# Patient Record
Sex: Male | Born: 1980 | Race: White | Hispanic: No | Marital: Single | Smoking: Never smoker
Health system: Southern US, Community
[De-identification: ages and names within clinical notes are randomized; demographics above are authoritative.]

## PROBLEM LIST (undated history)

## (undated) DIAGNOSIS — F419 Anxiety disorder, unspecified: Secondary | ICD-10-CM

## (undated) DIAGNOSIS — A419 Sepsis, unspecified organism: Secondary | ICD-10-CM

---

## 2014-03-30 DIAGNOSIS — A419 Sepsis, unspecified organism: Secondary | ICD-10-CM

## 2014-03-30 HISTORY — DX: Sepsis, unspecified organism: A41.9

## 2014-04-09 ENCOUNTER — Inpatient Hospital Stay: Payer: Self-pay | Admitting: Internal Medicine

## 2014-04-20 ENCOUNTER — Ambulatory Visit: Payer: Self-pay | Admitting: Registered Nurse

## 2014-04-21 ENCOUNTER — Ambulatory Visit: Payer: Self-pay | Admitting: Registered Nurse

## 2014-06-28 NOTE — H&P (Signed)
PATIENT NAME:  Philip Bright, Philip Bright MR#:  [id number] DATE OF BIRTH:  06-Apr-1980  DATE OF ADMISSION:  04/09/2014  REFERRING PHYSICIAN: Bobetta Lime A. Inocencio Homes, MD   PRIMARY CARE PHYSICIAN: None.   CHIEF COMPLAINT: Shortness of breath, palpitations.  HISTORY OF PRESENT ILLNESS: A 34 year old Caucasian gentleman without significant past medical history presenting with shortness of breath, palpitation. Symptoms originally started about 6 days ago when he had "some spider bites" to his left buttock and has been having purulent discharge as well as pain associated. The patient states he started taking some antibiotics about 4 days ago "for cats and dogs, something in the mycin family." Had original improvement of his symptoms surprisingly with some decrease in redness, decreased swelling; however, despite this still having subjective chills today and had an episode of palpitations and shortness of breath, thus presented to the hospital for further workup and evaluation. In the Emergency Department noted to have a heart rate in the 140s.   REVIEW OF SYSTEMS:  GENERAL: Positive for subjective fevers, chills, fatigue. Denies weakness.  EYES: Denies blurred vision, double vision, eye pain.  EARS, NOSE, THROAT: Denies tinnitus, ear pain, hearing loss. RESPIRATORY: Denies cough, wheeze, shortness of breath.  CARDIOVASCULAR: Denies chest pain, edema. Positive for palpitations.  GASTROINTESTINAL: Denies nausea, vomiting, diarrhea, or abdominal pain.  GENITOURINARY: Denies dysuria or hematuria.  ENDOCRINE: Denies nocturia or thyroid problems.  HEMATOLOGIC AND LYMPHATIC: Denies easy bruising, bleeding. SKIN: Positive for erythematous lesion on left buttock, as described above.  MUSCULOSKELETAL: Denies pain in neck, back, shoulder, knees, hips, or arthritic symptoms.  NEUROLOGIC: Denies paralysis or paresthesias.  PSYCHIATRIC: Denies anxiety or depressive symptoms. Otherwise, a full review of systems performed by me is  negative.   PAST MEDICAL HISTORY: None.   SOCIAL HISTORY: Denies any tobacco. Positive for occasional alcohol. Denies any drug use.   FAMILY HISTORY: Denies any known cardiovascular or pulmonary disorders.   ALLERGIES: TO BEE STINGS AS WELL AS POLLEN.   HOME MEDICATIONS: Include aspirin 81 p.o. daily.   PHYSICAL EXAMINATION:  VITAL SIGNS: Temperature 98, heart rate 142, respirations 18, blood pressure 147/118, saturating 100% on room air. Weight is 222 kg, BMI 68.3.  GENERAL: A well-nourished, well-developed, Caucasian male currently in no acute distress.  HEAD: Normocephalic, atraumatic.  EYES: Pupils equal, round, reactive to light. Extraocular muscles intact. No scleral icterus.  MOUTH: Moist mucosal membrane. Dentition intact. No abscess noted.   EAR, NOSE, AND THROAT: Clear without exudates. No external lesions. NECK: Supple. No thyromegaly. No nodules. No JVD.  PULMONARY: Clear to auscultation bilaterally without wheeze, rales, rhonchi. No use of accessory muscle. Good respiratory effort.  CHEST: Nontender to palpation.  CARDIOVASCULAR: S1 and S2, tachycardic. No murmurs, rubs, or gallops. No edema. Pedal pulses 2+ bilaterally.  GASTROINTESTINAL: Soft, nontender, nondistended. No masses. Positive bowel sounds. No hepatosplenomegaly.  MUSCULOSKELETAL: No swelling, clubbing, edema. Range of motion full in all extremities.  NEUROLOGIC: Cranial nerves II-XII intact. No gross focal neurological deficit. Sensation intact. Reflexes intact.  SKIN: In the left buttock there are 4 punctate lesions measuring approximately 1 x 1 cm at maximum with surrounding erythema, edema, also purulent discharge noted from one of the lesions. Otherwise, no further lesions, rashes, or cyanosis. Skin warm, dry. Turgor intact.  PSYCHIATRIC: Mood and affect within normal limits. Patient awake, alert, oriented x 3. Insight and judgment intact.   LABORATORY AND RADIOGRAPHIC DATA: Sodium 135, potassium 2.8,  chloride 100, bicarbonate of 23, BUN 18, creatinine 1.12, glucose 173. LFTs: AST of  51, ALT 111, CK of 614, CK-MB less than 5. WBC 17.8, hemoglobin of 14.9, platelets of 407,000. Lactic acid of 2.6. Had an ultrasound performed revealing foci heterogenous echogenicity, soft tissue measuring up to 1.3 cm in size. This area is concerning small foci of tissue necrosis; however, no abscess at this time.   ASSESSMENT AND PLAN: A 34 year old gentleman without significant past medical history presenting with palpitations and shortness of breath. Noted to have some spider bites about 6 days ago, took inappropriate antibiotics, now presenting for persistent symptoms.  1.  Sepsis: Meeting septic criteria by heart rate, leukocytosis, secondary to cellulitis as well as failed outpatient treatment. Blood cultures and vancomycin for antibiotic coverage. Intravenous fluid hydration, has received 30 mL/kg intravenous fluid bolus. Continue intravenous fluids to keep mean arterial pressure greater than 65.  2.  Lactic acidosis: Repeat lactic acid 3 hours after the initial and continue intravenous fluid hydration.  3.  Hypokalemia: Replace to goal of 4-5. Check magnesium level as well.  4.  Venous thromboembolism prophylaxis: Heparin subcutaneous.   CODE STATUS: The patient is a full code.   TIME SPENT: 45 minutes.    ____________________________ Cletis Athensavid K. Hower, MD dkh:bm D: 04/09/2014 23:53:00 ET T: 04/10/2014 00:30:35 ET JOB#: [id number]  cc: Cletis Athensavid K. Hower, MD, <Dictator> DAVID Synetta ShadowK HOWER MD ELECTRONICALLY SIGNED 04/11/2014 2:10

## 2014-06-28 NOTE — Consult Note (Signed)
Brief Consult Note: Diagnosis: Left buttock abscesses / cellulitis.   Patient was seen by consultant.   Orders entered.   Comments: WBC 18 ??? 11, lactate 2.6 ??? 1.0. U/S showed 3 areas of necrosis / pus: 13, 9, and 7mm, respectively. Patient says he feels much better. One of the abscesses began spontaneously draining this evening. On exam, he has pretty significant cellulitis, and 2 small areas of fluctuance. This is not perianal or perirectal disease. Most likely Staph. I have made him NPO after MN and will have my partner evaluate him for possible surgical drainage in the AM.  Electronic Signatures: Claude MangesMarterre, Sandor F (MD)  (Signed 12-Feb-16 23:25)  Authored: Brief Consult Note   Last Updated: 12-Feb-16 23:25 by Claude MangesMarterre, Sebastyan F (MD)

## 2014-06-28 NOTE — Discharge Summary (Signed)
PATIENT NAME:  Philip Bright, Philip Bright MR#:  [id number] DATE OF BIRTH:  1980-04-06  DATE OF ADMISSION:  04/09/2014 DATE OF DISCHARGE:  04/12/2014  ADMITTING DIAGNOSIS: Shortness of breath, palpitations.   DISCHARGE DIAGNOSES: 1. Sepsis with elevated heart rate.  2. Leukocytosis secondary to cellulitis as well as abscess involving his buttocks. No need for drainage. It spontaneously drained.  3. Lactic acidosis due to sepsis.  4. Hypokalemia.   PERTINENT LABORATORIES AND EVALUATIONS: Admitting glucose 173, BUN 18, creatinine 1.17, sodium 135, potassium 2.8, chloride 100, CO2 was 23, calcium 8.7. LFTs were normal, except slightly elevated AST and ALT. Troponin less than 0.02. CPK was 614. WBC on admission 17.8, hemoglobin was 14.9, platelet count was 407,000. Blood cultures x 2 are no growth. EKG showed sinus tachycardia.   CONSULTATIONS: Surgery, Dr. Vickie EpleyMark A. Byrd.   HOSPITAL COURSE: Please refer to H and P done by the admitting physician. The patient is a 34 year old, white male who presented to the hospital with complaint of shortness and palpitations. His symptoms were felt to be likely due to sepsis as a result of a buttocks abscess. The patient was admitted and started on broad-spectrum antibiotics. He did have spontaneous drainage of the abscess. He was seen by surgery and they did not feel that it needed to be drained. The patient was continued on antibiotics with spontaneous drainage and is doing well and is stable for discharge.   DISCHARGE MEDICATIONS: Aspirin 81 mg 1 tab p.o. daily, Tylenol 650 every 4 p.r.n. for pain, Cleocin 300 mg 1 tab p.o. every 6 hours x 5 days, amoxicillin/clavulanic acid 875/125 one tab p.o. every 12 x 5 days, clindamycin topically applied to affected area b.i.Bright. on a cotton.   ACTIVITY: As tolerated.   FOLLOWUP: [facility].   TIME SPENT: On this discharge is 35 minutes.      ____________________________ Lacie ScottsShreyang H. Allena KatzPatel, MD shp:TT Bright: 04/12/2014 17:24:01 ET T: 04/12/2014 18:52:47 ET JOB#: [id number]  cc: Sidni Fusco H. Allena KatzPatel, MD, <Dictator> Charise CarwinSHREYANG H Loukas Antonson MD ELECTRONICALLY SIGNED 04/17/2014 15:54

## 2014-08-20 ENCOUNTER — Ambulatory Visit
Admission: EM | Admit: 2014-08-20 | Discharge: 2014-08-20 | Disposition: A | Discharge status: Home / Self Care | Payer: Self-pay | Attending: Internal Medicine | Admitting: Internal Medicine

## 2014-08-20 ENCOUNTER — Encounter: Payer: Self-pay | Admitting: Emergency Medicine

## 2014-08-20 DIAGNOSIS — L03011 Cellulitis of right finger: Secondary | ICD-10-CM

## 2014-08-20 HISTORY — DX: Sepsis, unspecified organism: A41.9

## 2014-08-20 MED ORDER — SULFAMETHOXAZOLE-TRIMETHOPRIM 800-160 MG PO TABS: 1.0000 | ORAL_TABLET | Freq: Two times a day (BID) | ORAL | Status: AC

## 2014-08-20 MED ORDER — NAPROXEN 500 MG PO TABS: 500.0000 mg | ORAL_TABLET | Freq: Two times a day (BID) | ORAL | Status: AC

## 2014-08-20 MED ORDER — CEPHALEXIN 500 MG PO CAPS: 500.0000 mg | ORAL_CAPSULE | Freq: Four times a day (QID) | ORAL | Status: AC

## 2014-08-20 MED ORDER — SULFAMETHOXAZOLE-TRIMETHOPRIM 800-160 MG PO TABS: 1.0000 | ORAL_TABLET | Freq: Two times a day (BID) | ORAL | Status: DC

## 2014-08-20 NOTE — Discharge Instructions (Signed)
Cellulitis °Cellulitis is an infection of the skin and the tissue beneath it. The infected area is usually red and tender. Cellulitis occurs most often in the arms and lower legs.  °CAUSES  °Cellulitis is caused by bacteria that enter the skin through cracks or cuts in the skin. The most common types of bacteria that cause cellulitis are staphylococci and streptococci. °SIGNS AND SYMPTOMS  °· Redness and warmth. °· Swelling. °· Tenderness or pain. °· Fever. °DIAGNOSIS  °Your health care provider can usually determine what is wrong based on a physical exam. Blood tests may also be done. °TREATMENT  °Treatment usually involves taking an antibiotic medicine. °HOME CARE INSTRUCTIONS  °· Take your antibiotic medicine as directed by your health care provider. Finish the antibiotic even if you start to feel better. °· Keep the infected arm or leg elevated to reduce swelling. °· Apply a warm cloth to the affected area up to 4 times per day to relieve pain. °· Take medicines only as directed by your health care provider. °· Keep all follow-up visits as directed by your health care provider. °SEEK MEDICAL CARE IF:  °· You notice red streaks coming from the infected area. °· Your red area gets larger or turns dark in color. °· Your bone or joint underneath the infected area becomes painful after the skin has healed. °· Your infection returns in the same area or another area. °· You notice a swollen bump in the infected area. °· You develop new symptoms. °· You have a fever. °SEEK IMMEDIATE MEDICAL CARE IF:  °· You feel very sleepy. °· You develop vomiting or diarrhea. °· You have a general ill feeling (malaise) with muscle aches and pains. °MAKE SURE YOU:  °· Understand these instructions. °· Will watch your condition. °· Will get help right away if you are not doing well or get worse. °Document Released: 11/23/2004 Document Revised: 06/30/2013 Document Reviewed: 05/01/2011 °ExitCare® Patient Information ©2015 [employer].  This information is not intended to replace advice given to you by your health care provider. Make sure you discuss any questions you have with your health care provider. ° °Fingertip Infection °When an infection is around the nail, it is called a paronychia. When it appears over the tip of the finger, it is called a felon. These infections are due to minor injuries or cracks in the skin. If they are not treated properly, they can lead to bone infection and permanent damage to the fingernail. °Incision and drainage is necessary if a pus pocket (an abscess) has formed. Antibiotics and pain medicine may also be needed. Keep your hand elevated for the next 2-3 days to reduce swelling and pain. If a pack was placed in the abscess, it should be removed in 1-2 days by your caregiver. Soak the finger in warm water for 20 minutes 4 times daily to help promote drainage. °Keep the hands as dry as possible. Wear protective gloves with cotton liners. See your caregiver for follow-up care as recommended.  °HOME CARE INSTRUCTIONS  °· Keep wound clean, dry and dressed as suggested by your caregiver. °· Soak in warm salt water for fifteen minutes, four times per day for bacterial infections. °· Your caregiver will prescribe an antibiotic if a bacterial infection is suspected. Take antibiotics as directed and finish the prescription, even if the problem appears to be improving before the medicine is gone. °· Only take over-the-counter or prescription medicines for pain, discomfort, or fever as directed by your caregiver. °SEEK IMMEDIATE   MEDICAL CARE IF: °· There is redness, swelling, or increasing pain in the wound. °· Pus or any other unusual drainage is coming from the wound. °· An unexplained oral temperature above 102° F (38.9° C) develops. °· You notice a foul smell coming from the wound or dressing. °MAKE SURE YOU:  °· Understand these instructions. °· Monitor your condition. °· Contact your caregiver if you are getting worse  or not improving. °Document Released: 03/23/2004 Document Revised: 05/08/2011 Document Reviewed: 03/19/2008 °ExitCare® Patient Information ©2015 [employer]. This information is not intended to replace advice given to you by your health care provider. Make sure you discuss any questions you have with your health care provider. ° °

## 2014-08-20 NOTE — ED Provider Notes (Signed)
CSN: [id number]     Arrival date & time 08/20/14  4098 History   First MD Initiated Contact with Patient 08/20/14 1022     Chief Complaint  Patient presents with  . Cellulitis   (Consider location/radiation/quality/duration/timing/severity/associated sxs/prior Treatment) HPI Comments: Caucasian male awoke 21 Jun with red right right finger had mowed lawn day before and [employer] but not aware of any trauma.  Washed and applied triple antibiotic worsening pain and redness.  Wednesday now swollen over joint and today redness expanded to hand along with swelling/worsening pain and cold sweats.  Patient anxious as hospitalized February 2016 for sepsis due to buttock abscess.  Started applying left over clindamycin topical he had at home on 21 Jun along with OTC triple antibiotic.  The history is provided by the patient.    Past Medical History  Diagnosis Date  . Sepsis 03/2014   History reviewed. No pertinent past surgical history. History reviewed. No pertinent family history. History  Substance Use Topics  . Smoking status: Never Smoker   . Smokeless tobacco: Never Used  . Alcohol Use: Yes    Review of Systems  Constitutional: Positive for diaphoresis. Negative for fever, chills, activity change, appetite change and fatigue.  HENT: Negative for congestion, ear discharge and ear pain.   Eyes: Negative for photophobia, pain, discharge, redness, itching and visual disturbance.  Respiratory: Negative for cough, choking, shortness of breath, wheezing and stridor.   Cardiovascular: Negative for chest pain, palpitations and leg swelling.  Gastrointestinal: Negative for nausea, vomiting, diarrhea and blood in stool.  Endocrine: Negative for cold intolerance and heat intolerance.  Genitourinary: Negative for flank pain and difficulty urinating.  Musculoskeletal: Positive for myalgias and joint swelling. Negative for back pain, arthralgias, gait problem, neck pain and neck stiffness.  Skin:  Positive for color change and rash. Negative for pallor and wound.  Allergic/Immunologic: Negative for environmental allergies and food allergies.  Neurological: Negative for dizziness, tremors, seizures, syncope, facial asymmetry, speech difficulty, weakness, light-headedness, numbness and headaches.  Hematological: Negative for adenopathy. Does not bruise/bleed easily.  Psychiatric/Behavioral: Negative for behavioral problems, confusion, sleep disturbance and agitation.    Allergies  Bee venom  Home Medications   Prior to Admission medications   Medication Sig Start Date End Date Taking? Authorizing Provider  cephALEXin (KEFLEX) 500 MG capsule Take 1 capsule (500 mg total) by mouth 4 (four) times daily. 08/20/14   Barbaraann Barthel, NP  sulfamethoxazole-trimethoprim (BACTRIM DS,SEPTRA DS) 800-160 MG per tablet Take 1 tablet by mouth 2 (two) times daily. 08/20/14   Jarold Song Betancourt, NP   BP 143/89 mmHg  Pulse 85  Temp(Src) 97 F (36.1 C) (Tympanic)  Resp 16  Ht  (1.803 m)  Wt 215 lb (97.523 kg)  BMI 30.00 kg/m2  SpO2 100% Physical Exam  Constitutional: He is oriented to person, place, and time. Vital signs are normal. He appears well-developed and well-nourished. No distress.  HENT:  Head: Normocephalic and atraumatic.  Right Ear: External ear normal.  Left Ear: External ear normal.  Nose: Nose normal.  Mouth/Throat: Oropharynx is clear and moist. No oropharyngeal exudate.  Eyes: Conjunctivae, EOM and lids are normal. Pupils are equal, round, and reactive to light. Right eye exhibits no discharge. Left eye exhibits no discharge. No scleral icterus.  Neck: Trachea normal and normal range of motion. Neck supple. No tracheal deviation present.  Cardiovascular: Normal rate, regular rhythm, normal heart sounds and intact distal pulses.  Exam reveals no gallop and no friction rub.  No murmur heard. Pulmonary/Chest: Effort normal and breath sounds normal. No stridor. No  respiratory distress. He has no wheezes.  Abdominal: Soft. He exhibits no distension.  Musculoskeletal: Normal range of motion. He exhibits edema and tenderness.       Right hand: He exhibits tenderness and swelling. He exhibits normal range of motion, no bony tenderness, normal two-point discrimination, normal capillary refill, no deformity and no laceration. Normal sensation noted. Normal strength noted.       Hands: Induration DIP joint posterior TTP dry; bilateral arms/hands/face with diaphoresis/clammy patient reported anxious; erythematous macular streak posterior right 4th digit from DIP to MCP joint adjacent nonpitting edema 1+/4 most tender directly over DIP joint lesion papular/erythema/scale fine  Lymphadenopathy:    He has no cervical adenopathy.  Neurological: He is alert and oriented to person, place, and time.  Skin: Skin is warm and intact. Rash noted. Rash is macular. He is diaphoretic. There is erythema. No pallor.  Psychiatric: He has a normal mood and affect. His speech is normal and behavior is normal. Judgment and thought content normal. Cognition and memory are normal.  Nursing note and vitals reviewed.   ED Course  Procedures (including critical care time) Labs Review Labs Reviewed - No data to display  Imaging Review No results found.   MDM   1. Cellulitis of finger of right hand    Patient had sepsis from abscess buttock Feb 2016 required clindamycin topical, cleocin and augmentin.  Will cover for MRSA with bactrim DS po BID today and keflex for strep 500mg  po QID.  Continue triple antibiotic OTC/clindamycin topical to affected area at home.  If erythema spreads to forearm past wrist over next 36 hours to go to ER for IV antibiotics.  Do not immerse finger in dirty liquids e.g. Lake, pool, solvents, hot tub, bath; keep clean and dryExitcare handout on skin infection given to patient.  RTC if worsening erythema, pain, purulent discharge, fever.  Patient verbalized  understanding, agreed with plan of care and had no further questions at this time.      Barbaraann Barthel, NP 08/20/14 1114

## 2014-08-20 NOTE — ED Notes (Signed)
Patient c/o swelling, redness, and warmth in his right 4th finger since Tuesday.  Patient reports some drainage from the site.  Patient denies fevers.

## 2014-08-22 ENCOUNTER — Encounter: Payer: Self-pay | Admitting: Emergency Medicine

## 2014-08-22 ENCOUNTER — Ambulatory Visit
Admission: EM | Admit: 2014-08-22 | Discharge: 2014-08-22 | Disposition: A | Discharge status: Home / Self Care | Payer: Self-pay | Attending: Family Medicine | Admitting: Family Medicine

## 2014-08-22 DIAGNOSIS — F418 Other specified anxiety disorders: Secondary | ICD-10-CM

## 2014-08-22 DIAGNOSIS — L0291 Cutaneous abscess, unspecified: Secondary | ICD-10-CM

## 2014-08-22 MED ORDER — MUPIROCIN 2 % EX OINT: 1.0000 "application " | TOPICAL_OINTMENT | Freq: Two times a day (BID) | CUTANEOUS | Status: AC

## 2014-08-22 NOTE — ED Notes (Signed)
Patient presents here for the recheck of his abscess to the rt ring finger, states that he was seen here 2 days ago and was prescribed antibiotics and feels better. Is here for the recheck

## 2014-08-22 NOTE — ED Provider Notes (Signed)
CSN: [id number]     Arrival date & time 08/22/14  1232 History   First MD Initiated Contact with Patient 08/22/14 1253     Chief Complaint  Patient presents with  . Abscess   (Consider location/radiation/quality/duration/timing/severity/associated sxs/prior Treatment) HPI Comments: Caucasian male here for wound recheck to discuss his anxiety regarding recurrent cellulitis/abscess.  Patient reported he used finger nail clippers after sanitizing them last night to drain pus off finger that had come to head, redness and swelling of hand resolved and now only affected right ring finger.  Patient did not work past two days, resting, taking bactrim and keflex and applying topical clindamycin he has at home to affected area.  Pain improved throbbing resolved but still having pressure at affected area but can now touch affected area and no acute sharp pain.  Patient reported occasionally his stomach upset (cramping) from antibiotics and he gets anxious notices pulse rate increases has been monitoring with fitness watch/strap was 70 last night.  Sleeping well.  Anxious now as discussed incision and drainage of pus pocket in waiting room.  Patient is a 34 y.o. male presenting with abscess. The history is provided by the patient.  Abscess Location:  Finger Finger abscess location:  R ring finger Abscess quality: draining, induration, itching, painful, redness, warmth and weeping   Red streaking: yes   Duration:  2 days Progression:  Partially resolved Pain details:    Quality:  Pressure   Severity:  Mild   Duration:  2 days   Timing:  Constant   Progression:  Improving Chronicity:  New Context: not diabetes, not immunosuppression, not injected drug use, not insect bite/sting and not skin injury   Relieved by:  Oral antibiotics, topical antibiotics, draining/squeezing and NSAIDs Associated symptoms: no anorexia, no fatigue, no fever, no headaches, no nausea and no vomiting   Risk factors: prior  abscess   Risk factors: no family hx of MRSA and no hx of MRSA     Past Medical History  Diagnosis Date  . Sepsis 03/2014   History reviewed. No pertinent past surgical history. History reviewed. No pertinent family history. History  Substance Use Topics  . Smoking status: Never Smoker   . Smokeless tobacco: Never Used  . Alcohol Use: Yes    Review of Systems  Constitutional: Negative for fever, chills, diaphoresis, activity change, appetite change and fatigue.  HENT: Negative for congestion and mouth sores.   Eyes: Negative for photophobia, pain, discharge, redness, itching and visual disturbance.  Respiratory: Negative for cough, choking, chest tightness, shortness of breath, wheezing and stridor.   Cardiovascular: Negative for chest pain, palpitations and leg swelling.  Gastrointestinal: Positive for abdominal pain. Negative for nausea, vomiting, diarrhea, constipation, blood in stool, abdominal distention, anal bleeding, rectal pain and anorexia.  Endocrine: Negative for cold intolerance and heat intolerance.  Genitourinary: Negative for hematuria and difficulty urinating.  Musculoskeletal: Positive for myalgias. Negative for back pain, joint swelling, arthralgias, gait problem, neck pain and neck stiffness.  Skin: Positive for rash. Negative for color change, pallor and wound.  Allergic/Immunologic: Negative for environmental allergies and food allergies.  Neurological: Negative for dizziness, tremors, seizures, syncope, facial asymmetry, speech difficulty, weakness, light-headedness, numbness and headaches.  Hematological: Negative for adenopathy. Does not bruise/bleed easily.  Psychiatric/Behavioral: Negative for behavioral problems, confusion, sleep disturbance and agitation.    Allergies  Bee venom; Pollen extract; and Wasp venom  Home Medications   Prior to Admission medications   Medication Sig Start Date End Date Taking?  Authorizing Provider  cephALEXin (KEFLEX)  500 MG capsule Take 1 capsule (500 mg total) by mouth 4 (four) times daily. 08/20/14  Yes Barbaraann Barthel, NP  naproxen (NAPROSYN) 500 MG tablet Take 1 tablet (500 mg total) by mouth 2 (two) times daily with a meal. 08/20/14  Yes Barbaraann Barthel, NP  sulfamethoxazole-trimethoprim (BACTRIM DS,SEPTRA DS) 800-160 MG per tablet Take 1 tablet by mouth 2 (two) times daily. 08/20/14  Yes Barbaraann Barthel, NP  mupirocin ointment (BACTROBAN) 2 % Apply 1 application topically 2 (two) times daily. Until healed 08/22/14   Barbaraann Barthel, NP   BP 148/87 mmHg  Pulse 108  Temp(Src) 98 F (36.7 C) (Oral)  Resp 18  Ht 5\' 11"  (1.803 m)  Wt 215 lb (97.523 kg)  BMI 30.00 kg/m2  SpO2 100% Physical Exam  Constitutional: He is oriented to person, place, and time. Vital signs are normal. He appears well-developed and well-nourished. No distress.  HENT:  Head: Normocephalic and atraumatic.  Right Ear: External ear normal.  Left Ear: External ear normal.  Nose: Nose normal.  Mouth/Throat: Oropharynx is clear and moist.  Eyes: Conjunctivae, EOM and lids are normal. Pupils are equal, round, and reactive to light. Right eye exhibits no discharge. Left eye exhibits no discharge. No scleral icterus.  Neck: Trachea normal and normal range of motion. Neck supple. No tracheal deviation present.  Cardiovascular: Normal rate, regular rhythm, normal heart sounds and intact distal pulses.  Exam reveals no gallop and no friction rub.   No murmur heard. Pulmonary/Chest: Effort normal and breath sounds normal. No respiratory distress. He has no wheezes.  Abdominal: Soft. Bowel sounds are normal. He exhibits no shifting dullness, no distension, no pulsatile liver, no fluid wave, no abdominal bruit, no ascites, no pulsatile midline mass and no mass. There is no tenderness. There is no rebound and no guarding.  Dull to percussion x 4 quads  Musculoskeletal: Normal range of motion. He exhibits edema and tenderness.       Right  forearm: Normal.       Right hand: He exhibits tenderness and swelling. He exhibits normal range of motion, no bony tenderness, normal two-point discrimination, normal capillary refill, no deformity and no laceration. Normal sensation noted. Normal strength noted.       Hands: 0-1 nonpitting edema right 4th finger dorsum only with edema/erythema  Lymphadenopathy:    He has no cervical adenopathy.  Neurological: He is alert and oriented to person, place, and time. He exhibits normal muscle tone. Coordination normal.  Skin: Skin is warm, dry and intact. Rash noted. No abrasion, no bruising, no burn, no ecchymosis, no laceration, no lesion, no petechiae and no purpura noted. Rash is macular and pustular. Rash is not papular, not maculopapular, not nodular, not vesicular and not urticarial. He is not diaphoretic. There is erythema. No cyanosis. No pallor. Nails show no clubbing.     Psychiatric: He has a normal mood and affect. His speech is normal and behavior is normal. Judgment and thought content normal. Cognition and memory are normal.  Nursing note and vitals reviewed.   ED Course  INCISION AND DRAINAGE Date/Time: 08/22/2014 1:30 PM Performed by: Albina Billet A Authorized by: Payton Mccallum Consent: Verbal consent obtained. Written consent not obtained. Risks and benefits: risks, benefits and alternatives were discussed Consent given by: patient Patient understanding: patient states understanding of the procedure being performed Patient identity confirmed: verbally with patient, arm band and provided demographic data Type: abscess Body area:  upper extremity Location details: right ring finger Anesthetic total: 0 ml Patient sedated: no Incision type: single straight Complexity: simple Drainage: purulent and  serosanguinous Drainage amount: scant Wound treatment: wound left open Packing material: none Patient tolerance: Patient tolerated the procedure well with no immediate  complications Comments: Cleansed skin with hibiclens; 15 blade used to unroof pustule less than 1ml purulent material obtained scant bleeding controlled with 2x2 gauze and pressure direct application dorsum right 3rd finger; simple dressing triple antibiotic 2x2 gauze and cobain to secure applied over   (including critical care time) Labs Review Labs Reviewed - No data to display  Imaging Review No results found.   MDM   1. Abscess   2. Anxiety about health   Plan: 1. diagnosis reviewed with patient 2. rx as per orders; risks, benefits, potential side effects reviewed with patient 3. Recommend supportive treatment with tylenol, rest, routine skin hygiene/laundering of sheets/clothes 4. F/u prn if symptoms worsen or don't improve  . If antibiotic medicine is given, take it as directed. Finish it even if you start to feel better.  . Only take over-the-counter or prescription medicines for pain, discomfort, or fever as directed by your caregiver.  Marland Kitchen Keep all follow-up appointments as directed by your caregiver.  . Change any bandages (dressings) as directed by your caregiver. Replace old dressings with clean dressings.  Reyes Ivan your hands before and after caring for your wound. You will receive specific instructions for cleansing and caring for your wound.  SEEK MEDICAL CARE IF:  . You have increased pain, swelling, or redness around the wound.  . You have increased drainage, smell, or bleeding from the wound.  . You have muscle aches, chills, or you feel generally sick.  . You have a fever. MAKE SURE YOU:  . Understand these instructions.  . Will watch your condition.  Will get help right away if you are not doing well or get worse.  Continue bactrim DS and keflex.  Naproxen as needed for pain.  Has clindamycin topical at home from previous cellulitis may continue use as improving or switch to bactroban that was ordered today topical BID.  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  Do not immerse finger in dirty liquids e.g. Lake, pool, solvents, hot tub, bath; keep clean and dryExitcare handout on skin infection given to patient. RTC if worsening erythema, pain, purulent discharge, fever. Patient verbalized understanding, agreed with plan of care and had no further questions at this time.    Patient stated anxiety does not cause problems at work and able to complete all duties.  Discussed self-referral to chaplain, mental health to discuss issues that he worries about.  He is also going to try writing things down in notebook as part of bedtime routine and try shower/bath, warm drink, regular bedtime schedule to improve his sleep hygiene.  Exercise during the day Discussed sleep hygiene, maintaining routine.  No S/I or H/I, plan, or ideation.  Patient is reliable. Trial of relaxation apps for phone, self-help anxiety/relaxation books as does not want to start formal appts at this time and continue self care.  Preferred no medications at this time.   Discussed other resources patient may use if symptoms worsen EMERGENCY ROOM, chaplain, PCM on call or Urgent Care Center.  Return to the clinic if any new or worsening symptoms.  Patient verbalized understanding of information/instructions, agreed with plan of care and had no further questions at this time. P2:  Diet and Exercise.  Stress reduction.   Barbaraann Barthel, NP 08/22/14 1812

## 2014-08-22 NOTE — Discharge Instructions (Signed)
Abscess °An abscess is an infected area that contains a collection of pus and debris. It can occur in almost any part of the body. An abscess is also known as a furuncle or boil. °CAUSES  °An abscess occurs when tissue gets infected. This can occur from blockage of oil or sweat glands, infection of hair follicles, or a minor injury to the skin. As the body tries to fight the infection, pus collects in the area and creates pressure under the skin. This pressure causes pain. People with weakened immune systems have difficulty fighting infections and get certain abscesses more often.  °SYMPTOMS °Usually an abscess develops on the skin and becomes a painful mass that is red, warm, and tender. If the abscess forms under the skin, you may feel a moveable soft area under the skin. Some abscesses break open (rupture) on their own, but most will continue to get worse without care. The infection can spread deeper into the body and eventually into the bloodstream, causing you to feel ill.  °DIAGNOSIS  °Your caregiver will take your medical history and perform a physical exam. A sample of fluid may also be taken from the abscess to determine what is causing your infection. °TREATMENT  °Your caregiver may prescribe antibiotic medicines to fight the infection. However, taking antibiotics alone usually does not cure an abscess. Your caregiver may need to make a small cut (incision) in the abscess to drain the pus. In some cases, gauze is packed into the abscess to reduce pain and to continue draining the area. °HOME CARE INSTRUCTIONS  °· Only take over-the-counter or prescription medicines for pain, discomfort, or fever as directed by your caregiver. °· If you were prescribed antibiotics, take them as directed. Finish them even if you start to feel better. °· If gauze is used, follow your caregiver's directions for changing the gauze. °· To avoid spreading the infection: °· Keep your draining abscess covered with a  bandage. °· Wash your hands well. °· Do not share personal care items, towels, or whirlpools with others. °· Avoid skin contact with others. °· Keep your skin and clothes clean around the abscess. °· Keep all follow-up appointments as directed by your caregiver. °SEEK MEDICAL CARE IF:  °· You have increased pain, swelling, redness, fluid drainage, or bleeding. °· You have muscle aches, chills, or a general ill feeling. °· You have a fever. °MAKE SURE YOU:  °· Understand these instructions. °· Will watch your condition. °· Will get help right away if you are not doing well or get worse. °Document Released: 11/23/2004 Document Revised: 08/15/2011 Document Reviewed: 04/28/2011 °[employer]® Patient Information ©2015 [employer]. This information is not intended to replace advice given to you by your health care provider. Make sure you discuss any questions you have with your health care provider. ° °Abscess °Care After °An abscess (also called a boil or furuncle) is an infected area that contains a collection of pus. Signs and symptoms of an abscess include pain, tenderness, redness, or hardness, or you may feel a moveable soft area under your skin. An abscess can occur anywhere in the body. The infection may spread to surrounding tissues causing cellulitis. A cut (incision) by the surgeon was made over your abscess and the pus was drained out. Gauze may have been packed into the space to provide a drain that will allow the cavity to heal from the inside outwards. The boil may be painful for 5 to 7 days. Most people with a boil do not have   high fevers. Your abscess, if seen early, may not have localized, and may not have been lanced. If not, another appointment may be required for this if it does not get better on its own or with medications. HOME CARE INSTRUCTIONS   Only take over-the-counter or prescription medicines for pain, discomfort, or fever as directed by your caregiver.  When you bathe, soak and then  remove gauze or iodoform packs at least daily or as directed by your caregiver. You may then wash the wound gently with mild soapy water. Repack with gauze or do as your caregiver directs. SEEK IMMEDIATE MEDICAL CARE IF:   You develop increased pain, swelling, redness, drainage, or bleeding in the wound site.  You develop signs of generalized infection including muscle aches, chills, fever, or a general ill feeling.  An oral temperature above 102 F (38.9 C) develops, not controlled by medication. See your caregiver for a recheck if you develop any of the symptoms described above. If medications (antibiotics) were prescribed, take them as directed. Document Released: 09/01/2004 Document Revised: 05/08/2011 Document Reviewed: 04/29/2007 [facility] [place] Patient Information 2015 [place], [place]. This information is not intended to replace advice given to you by your health care provider. Make sure you discuss any questions you have with your health care provider.  Cellulitis Cellulitis is an infection of the skin and the tissue under the skin. The infected area is usually red and tender. This happens most often in the arms and lower legs. HOME CARE   Take your antibiotic medicine as told. Finish the medicine even if you start to feel better.  Keep the infected arm or leg raised (elevated).  Put a warm cloth on the area up to 4 times per day.  Only take medicines as told by your doctor.  Keep all doctor visits as told. GET HELP IF:  You see red streaks on the skin coming from the infected area.  Your red area gets bigger or turns a dark color.  Your bone or joint under the infected area is painful after the skin heals.  Your infection comes back in the same area or different area.  You have a puffy (swollen) bump in the infected area.  You have new symptoms.  You have a fever. GET HELP RIGHT AWAY IF:   You feel very sleepy.  You throw up (vomit) or have watery poop  (diarrhea).  You feel sick and have muscle aches and pains. MAKE SURE YOU:   Understand these instructions.  Will watch your condition.  Will get help right away if you are not doing well or get worse. Document Released: 08/02/2007 Document Revised: 06/30/2013 Document Reviewed: 05/01/2011 [facility] Patient Information 2015 [place], [place]. This information is not intended to replace advice given to you by your health care provider. Make sure you discuss any questions you have with your health care provider.

## 2016-02-25 IMAGING — US US SOFT TISSUE EXCLUDE HEAD/NECK
1 series · 14 of 19 positions shown · non-contrast
Comparison: None.

CLINICAL DATA: Spider bites on left medial buttocks; evaluate for
abscess. Initial encounter.

EXAM:
US SOFT TISSUE PELVIS
TECHNIQUE: Ultrasound examination of the pelvic soft tissues was performed in
the area of clinical concern.

[Series 1: us soft tissue exclude head/neck · 0.04mm/px · 14 of 19 slices shown]
[im 1/19]
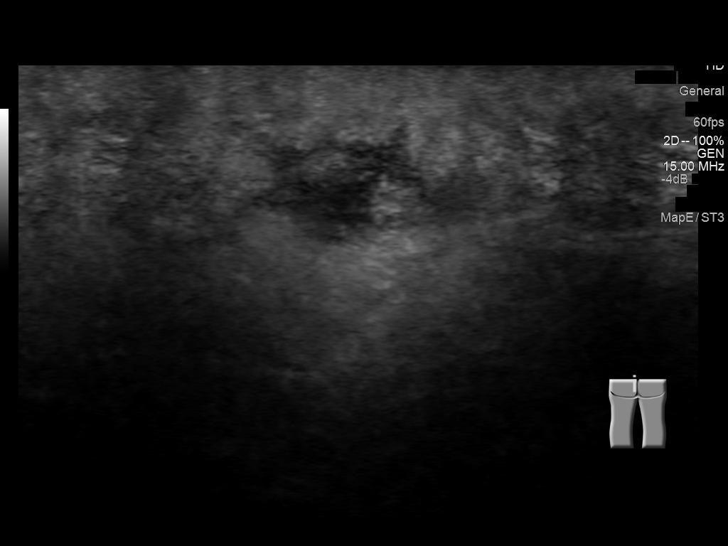
[im 3/19]
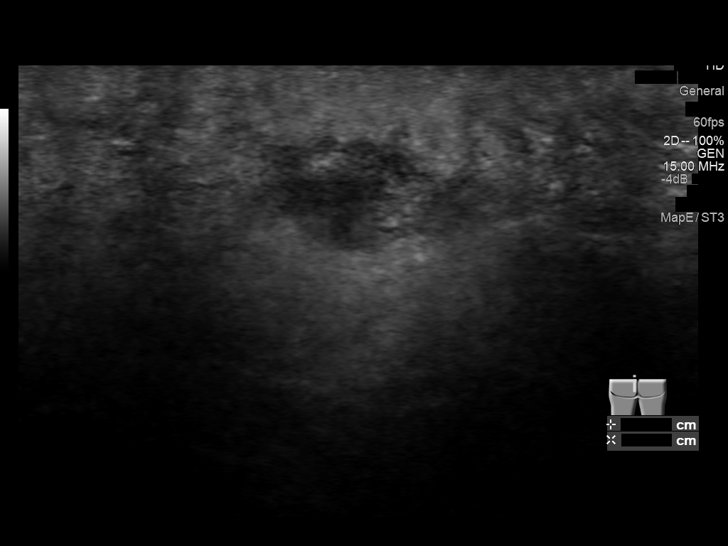
[im 4/19]
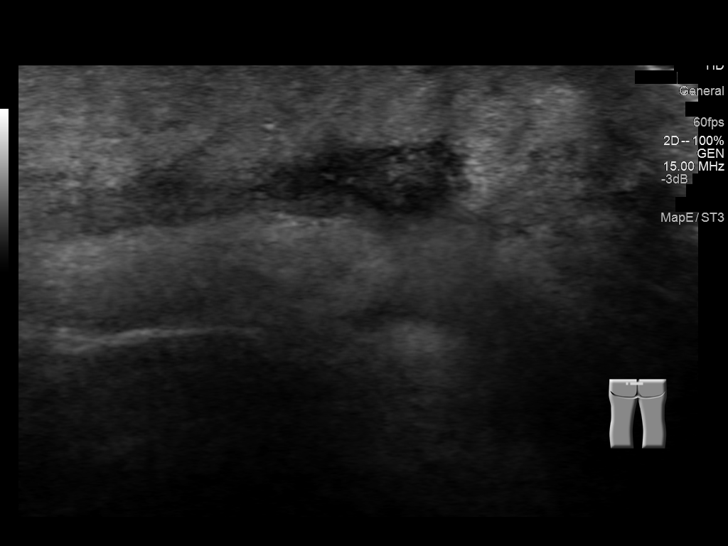
[im 5/19]
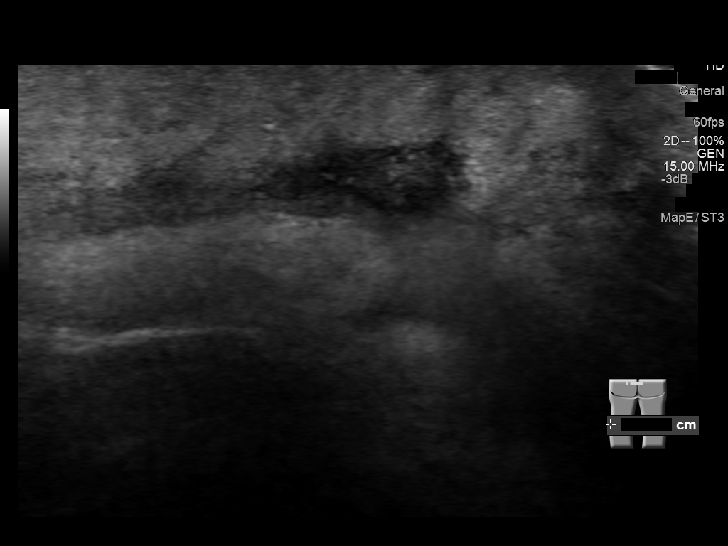
[im 7/19]
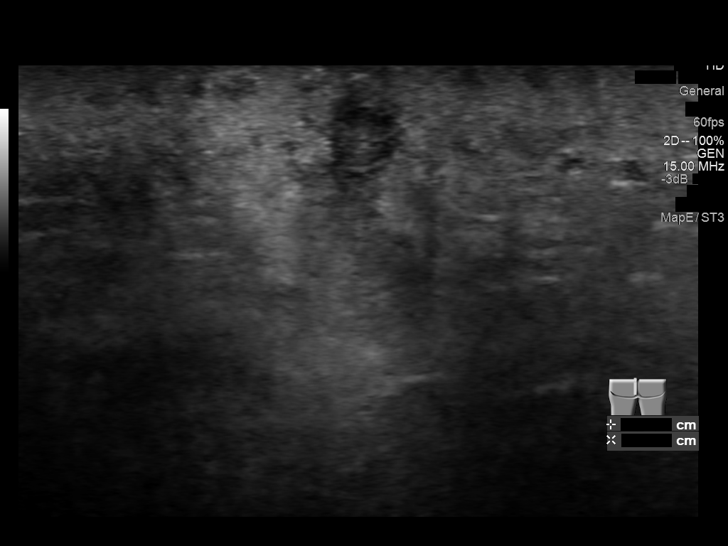
[im 8/19]
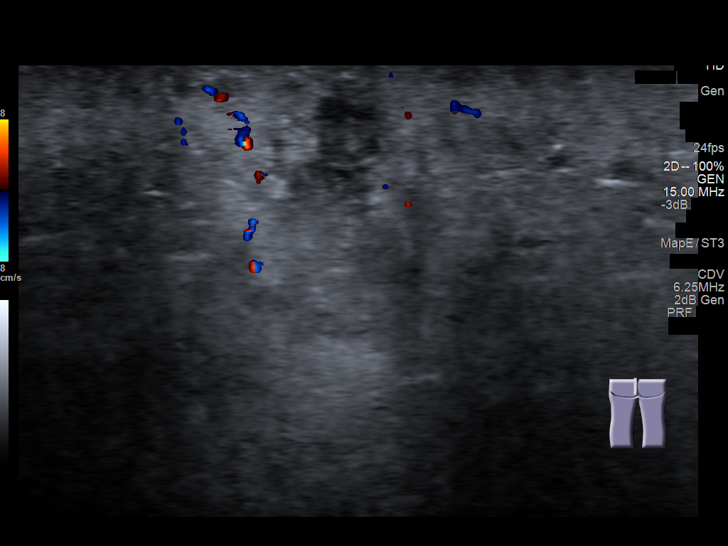
[im 9/19]
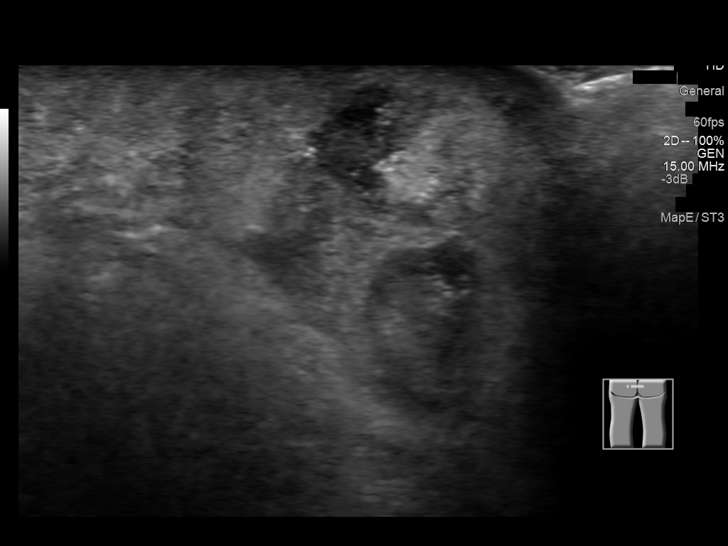
[im 11/19]
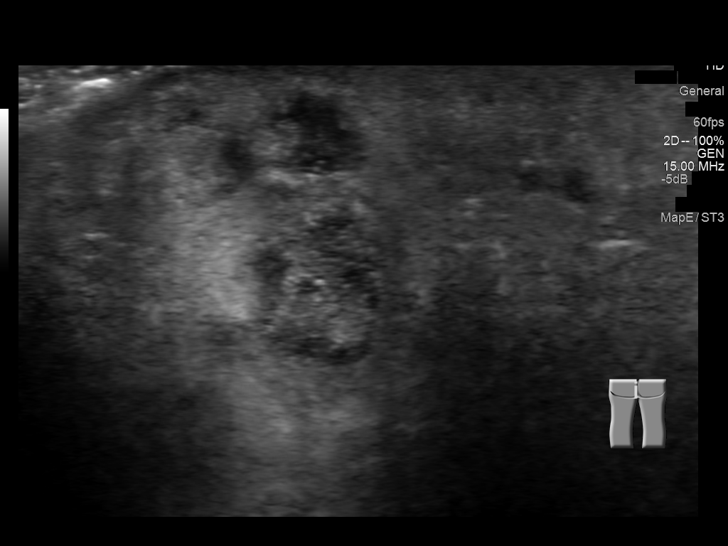
[im 12/19]
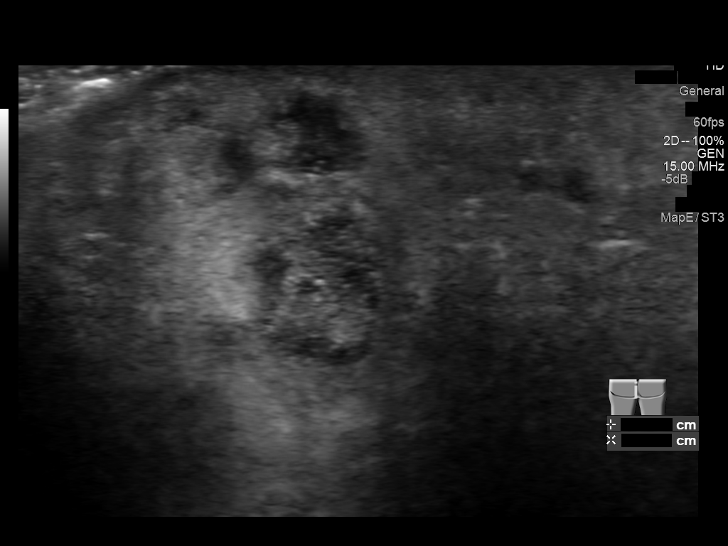
[im 13/19]
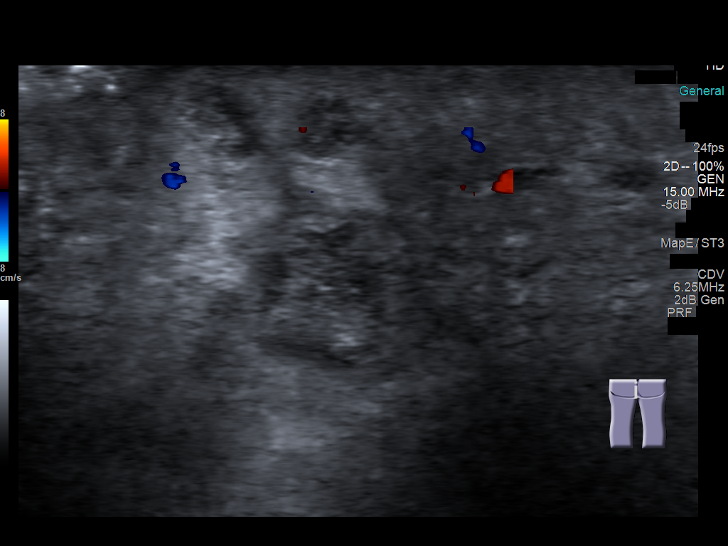
[im 15/19]
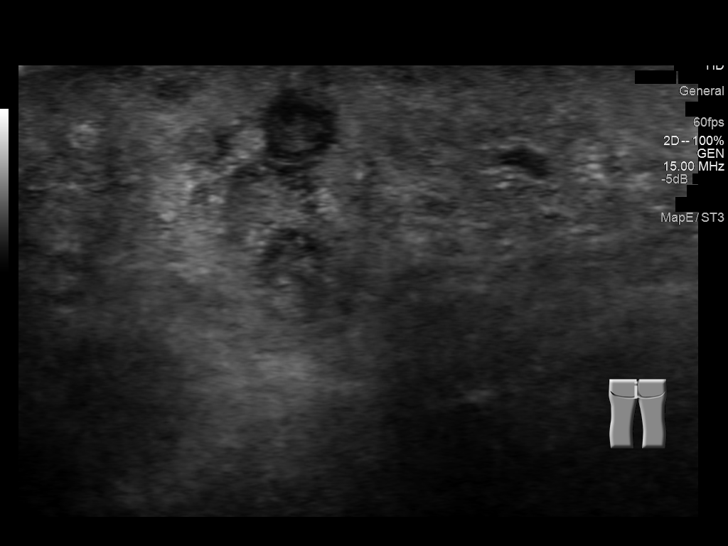
[im 16/19]
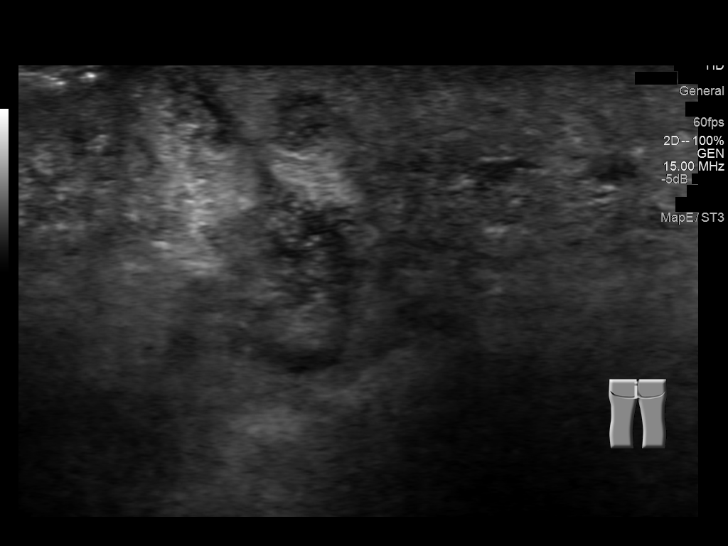
[im 17/19]
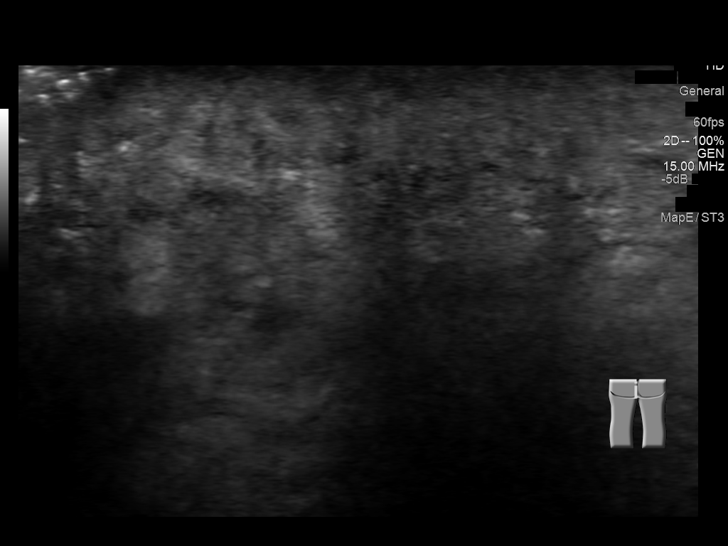
[im 19/19]
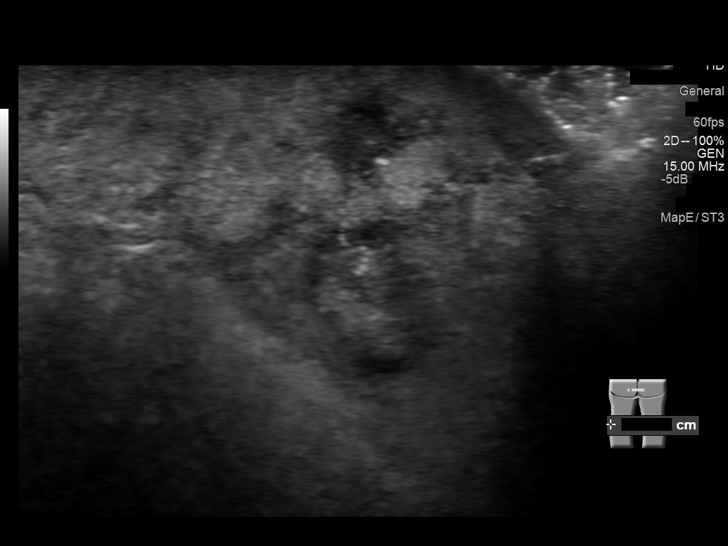

[14 of 19 positions shown; findings below may reference images not displayed]

FINDINGS: Three focal areas of heterogeneous echogenicity are seen within the
soft tissues of the medial left buttocks, measuring 1.3 x 1.2 x
cm, 0.9 x 0.7 x 0.7 cm, and 0.7 x 0.7 x 0.5 cm. No associated blood
flow is seen on limited color Doppler evaluation. These are
concerning for small foci of tissue necrosis, corresponding to the
patient's spider bites. No focal abscess is seen at this time.
IMPRESSION: Three small foci of heterogeneous echogenicity noted within the soft
tissues of the medial left buttock, measuring up to 1.3 cm in size.
No associated blood flow noted; these are concerning for small foci
of tissue necrosis, corresponding to the patient's spider bites. No
focal abscess seen at this time.

## 2016-12-26 ENCOUNTER — Ambulatory Visit
Admission: EM | Admit: 2016-12-26 | Discharge: 2016-12-26 | Disposition: A | Discharge status: Home / Self Care | Payer: Self-pay | Attending: Family Medicine | Admitting: Family Medicine

## 2016-12-26 DIAGNOSIS — Z79899 Other long term (current) drug therapy: Secondary | ICD-10-CM | POA: Insufficient documentation

## 2016-12-26 DIAGNOSIS — F419 Anxiety disorder, unspecified: Secondary | ICD-10-CM | POA: Insufficient documentation

## 2016-12-26 DIAGNOSIS — R61 Generalized hyperhidrosis: Secondary | ICD-10-CM

## 2016-12-26 DIAGNOSIS — R002 Palpitations: Secondary | ICD-10-CM | POA: Insufficient documentation

## 2016-12-26 DIAGNOSIS — E876 Hypokalemia: Secondary | ICD-10-CM | POA: Insufficient documentation

## 2016-12-26 HISTORY — DX: Anxiety disorder, unspecified: F41.9

## 2016-12-26 LAB — BASIC METABOLIC PANEL
ANION GAP: 10 (ref 5–15)
BUN: 15 mg/dL (ref 6–20)
CHLORIDE: 101 mmol/L (ref 101–111)
CO2: 24 mmol/L (ref 22–32)
Calcium: 8.9 mg/dL (ref 8.9–10.3)
Creatinine, Ser: 1.08 mg/dL (ref 0.61–1.24)
GFR calc Af Amer: >60 mL/min (ref >60)
GLUCOSE: 122 mg/dL — AB (ref 65–99)
POTASSIUM: 3.4 mmol/L — AB (ref 3.5–5.1)
Sodium: 135 mmol/L (ref 135–145)

## 2016-12-26 LAB — CBC WITH DIFFERENTIAL/PLATELET
BASOS ABS: 0.1 10*3/uL (ref 0–0.1)
Basophils Relative: 1 %
EOS PCT: 1 %
Eosinophils Absolute: 0.2 10*3/uL (ref 0–0.7)
HEMATOCRIT: 46.6 % (ref 40.0–52.0)
Hemoglobin: 16.2 g/dL (ref 13.0–18.0)
LYMPHS ABS: 1.1 10*3/uL (ref 1.0–3.6)
LYMPHS PCT: 9 %
MCH: 30.2 pg (ref 26.0–34.0)
MCHC: 34.7 g/dL (ref 32.0–36.0)
MCV: 87 fL (ref 80.0–100.0)
MONO ABS: 0.4 10*3/uL (ref 0.2–1.0)
MONOS PCT: 3 %
NEUTROS ABS: 10.4 10*3/uL — AB (ref 1.4–6.5)
Neutrophils Relative %: 86 %
PLATELETS: 296 10*3/uL (ref 150–440)
RBC: 5.36 MIL/uL (ref 4.40–5.90)
RDW: 12.4 % (ref 11.5–14.5)
WBC: 12.1 10*3/uL — ABNORMAL HIGH (ref 3.8–10.6)

## 2016-12-26 NOTE — ED Triage Notes (Signed)
Pt reports he had a one minute episode around 1:30 today of feeling a thump in his chest and then felt his heart racing. He reports this happened before a couple years ago.

## 2016-12-26 NOTE — ED Provider Notes (Signed)
[facility]    CSN: [id number] Arrival date & time: 12/26/16  1412     History   Chief Complaint Chief Complaint  Patient presents with  . Palpitations    HPI Philip Bright is a 36 y.o. male.    Palpitations  Palpitations quality:  Fast Onset quality:  Sudden Duration:  2 minutes Timing:  Constant Progression:  Resolved Chronicity:  New Context: anxiety   Relieved by:  None tried Ineffective treatments:  None tried Associated symptoms: diaphoresis   Associated symptoms: no back pain, no chest pain, no chest pressure, no cough, no dizziness, no hemoptysis, no leg pain, no lower extremity edema, no malaise/fatigue, no nausea, no near-syncope, no numbness, no orthopnea, no PND, no shortness of breath, no syncope, no vomiting and no weakness   Risk factors: stress   Risk factors: no diabetes mellitus, no heart disease, no hx of atrial fibrillation, no hx of DVT, no hx of PE, no hx of thyroid disease and no OTC sinus medications     Past Medical History:  Diagnosis Date  . Anxiety   . Sepsis (HCC) 03/2014    There are no active problems to display for this patient.   History reviewed. No pertinent surgical history.     Home Medications    Prior to Admission medications   Medication Sig Start Date End Date Taking? Authorizing Provider  cephALEXin (KEFLEX) 500 MG capsule Take 1 capsule (500 mg total) by mouth 4 (four) times daily. 08/20/14   Betancourt, Jarold Song, NP  mupirocin ointment (BACTROBAN) 2 % Apply 1 application topically 2 (two) times daily. Until healed 08/22/14   Betancourt, Jarold Song, NP  naproxen (NAPROSYN) 500 MG tablet Take 1 tablet (500 mg total) by mouth 2 (two) times daily with a meal. 08/20/14   Betancourt, Jarold Song, NP  sulfamethoxazole-trimethoprim (BACTRIM DS,SEPTRA DS) 800-160 MG per tablet Take 1 tablet by mouth 2 (two) times daily. 08/20/14   Betancourt, Jarold Song, NP    Family History History reviewed. No pertinent family  history.  Social History Social History  Substance Use Topics  . Smoking status: Never Smoker  . Smokeless tobacco: Never Used  . Alcohol use Yes     Comment: rare     Allergies   Bee venom; Pollen extract; and Wasp venom   Review of Systems Review of Systems  Constitutional: Positive for diaphoresis. Negative for malaise/fatigue.  Respiratory: Negative for cough, hemoptysis and shortness of breath.   Cardiovascular: Positive for palpitations. Negative for chest pain, orthopnea, syncope, PND and near-syncope.  Gastrointestinal: Negative for nausea and vomiting.  Musculoskeletal: Negative for back pain.  Neurological: Negative for dizziness, weakness and numbness.     Physical Exam Triage Vital Signs ED Triage Vitals  Enc Vitals Group     BP 12/26/16 1426 (!) 148/96     Pulse Rate 12/26/16 1426 (!) 108     Resp 12/26/16 1426 18     Temp 12/26/16 1426 98.2 F (36.8 C)     Temp Source 12/26/16 1426 Oral     SpO2 12/26/16 1426 100 %     Weight 12/26/16 1427 221 lb (100.2 kg)     Height 12/26/16 1427 5\' 11"  (1.803 m)     Head Circumference --      Peak Flow --      Pain Score --      Pain Loc --      Pain Edu? --      Excl. in  GC? --    No data found.   Updated Vital Signs BP (!) 148/96 (BP Location: Left Arm)   Pulse (!) 108   Temp 98.2 F (36.8 C) (Oral)   Resp 18   Ht 5\' 11"  (1.803 m)   Wt 221 lb (100.2 kg)   SpO2 100%   BMI 30.82 kg/m   Visual Acuity Right Eye Distance:   Left Eye Distance:   Bilateral Distance:    Right Eye Near:   Left Eye Near:    Bilateral Near:     Physical Exam  Constitutional: He appears well-developed and well-nourished. No distress.  HENT:  Head: Normocephalic and atraumatic.  Right Ear: Tympanic membrane, external ear and ear canal normal.  Left Ear: Tympanic membrane, external ear and ear canal normal.  Nose: Nose normal.  Mouth/Throat: Uvula is midline, oropharynx is clear and moist and mucous membranes are  normal. No oropharyngeal exudate or tonsillar abscesses.  Eyes: Pupils are equal, round, and reactive to light. Conjunctivae and EOM are normal. Right eye exhibits no discharge. Left eye exhibits no discharge. No scleral icterus.  Neck: Normal range of motion. Neck supple. No tracheal deviation present. No thyromegaly present.  Cardiovascular: Normal rate, regular rhythm and normal heart sounds.   Pulmonary/Chest: Effort normal and breath sounds normal. No stridor. No respiratory distress. He has no wheezes. He has no rales. He exhibits no tenderness.  Lymphadenopathy:    He has no cervical adenopathy.  Neurological: He is alert.  Skin: Skin is warm and dry. No rash noted. He is not diaphoretic.  Nursing note and vitals reviewed.    UC Treatments / Results  Labs (all labs ordered are listed, but only abnormal results are displayed) Labs Reviewed  BASIC METABOLIC PANEL - Abnormal; Notable for the following:       Result Value   Potassium 3.4 (*)    Glucose, Bld 122 (*)    All other components within normal limits  CBC WITH DIFFERENTIAL/PLATELET - Abnormal; Notable for the following:    WBC 12.1 (*)    Neutro Abs 10.4 (*)    All other components within normal limits    EKG  EKG Interpretation None       Radiology No results found.  Procedures ED EKG Date/Time: 12/26/2016 3:13 PM Performed by: Payton MccallumONTY, Alencia Gordon Authorized by: Payton MccallumONTY, Aeon Kessner   ECG reviewed by ED Physician in the absence of a cardiologist: yes   Previous ECG:    Previous ECG:  Unavailable Interpretation:    Interpretation: normal   Rate:    ECG rate:  97   ECG rate assessment: normal   Rhythm:    Rhythm: sinus rhythm   Ectopy:    Ectopy: none   QRS:    QRS axis:  Normal Conduction:    Conduction: normal   ST segments:    ST segments:  Normal T waves:    T waves: normal      (including critical care time)  Medications Ordered in UC Medications - No data to display   Initial Impression /  Assessment and Plan / UC Course  I have reviewed the triage vital signs and the nursing notes.  Pertinent labs & imaging results that were available during my care of the patient were reviewed by me and considered in my medical decision making (see chart for details).        Final Clinical Impressions(s) / UC Diagnoses   Final diagnoses:  Heart palpitations  Hypokalemia  New Prescriptions Discharge Medication List as of 12/26/2016  3:46 PM     1. Labs/ekg results and diagnosis reviewed with patient 2. Recommend supportive treatment with increased potassium in diet 3. Follow-up prn if symptoms worsen or don't improve Controlled Substance Prescriptions [place] Controlled Substance Registry consulted? Not Applicable   Payton Mccallum, MD 12/26/16 1600

## 2017-11-24 ENCOUNTER — Ambulatory Visit
Admission: EM | Admit: 2017-11-24 | Discharge: 2017-11-24 | Disposition: A | Discharge status: Home / Self Care | Payer: Self-pay | Attending: Orthopedic Surgery | Admitting: Orthopedic Surgery

## 2017-11-24 ENCOUNTER — Other Ambulatory Visit: Payer: Self-pay

## 2017-11-24 DIAGNOSIS — W268XXA Contact with other sharp object(s), not elsewhere classified, initial encounter: Secondary | ICD-10-CM

## 2017-11-24 DIAGNOSIS — Z23 Encounter for immunization: Secondary | ICD-10-CM

## 2017-11-24 DIAGNOSIS — S61032A Puncture wound without foreign body of left thumb without damage to nail, initial encounter: Secondary | ICD-10-CM

## 2017-11-24 MED ORDER — TETANUS-DIPHTH-ACELL PERTUSSIS 5-2.5-18.5 LF-MCG/0.5 IM SUSP
0.5000 mL | Freq: Once | INTRAMUSCULAR | Status: AC
  Administered 2017-11-24: 0.5 mL via INTRAMUSCULAR

## 2017-11-24 NOTE — Discharge Instructions (Addendum)
Please continue to keep left thumb clean, apply Neosporin daily.  If any swelling, increasing pain, redness return to the clinic.

## 2017-11-24 NOTE — ED Provider Notes (Signed)
[facility]    CSN: [id number] Arrival date & time: 11/24/17  1006     History   Chief Complaint Chief Complaint  Patient presents with  . Immunizations    HPI Philip Bright is a 37 y.o. male presents to the urgent care facility for evaluation of puncture wound to the ulnar aspect of the left thumb.  Patient states Thursday around 10 PM he had a metal punch, solid piece of metal that he was hitting towards his thumb with a hammer when the middle part slipped and went into his thumb.  Patient states no chance of any type of foreign body.  He does not have any pain along the thumb, no swelling warmth or redness.  He treated the wound initially with cleaning it out applying antibiotic ointment.  Pain is 0 out of 10.  There is been no drainage noted.  He denies any palpable foreign body.  Patient is here today because he wants a tetanus shot, he is unsure when his last tetanus was.  HPI  Past Medical History:  Diagnosis Date  . Anxiety   . Sepsis (HCC) 03/2014    There are no active problems to display for this patient.   History reviewed. No pertinent surgical history.     Home Medications    Prior to Admission medications   Medication Sig Start Date End Date Taking? Authorizing Provider  cephALEXin (KEFLEX) 500 MG capsule Take 1 capsule (500 mg total) by mouth 4 (four) times daily. 08/20/14   Betancourt, Jarold Song, NP  mupirocin ointment (BACTROBAN) 2 % Apply 1 application topically 2 (two) times daily. Until healed 08/22/14   Betancourt, Jarold Song, NP  naproxen (NAPROSYN) 500 MG tablet Take 1 tablet (500 mg total) by mouth 2 (two) times daily with a meal. 08/20/14   Betancourt, Jarold Song, NP  sulfamethoxazole-trimethoprim (BACTRIM DS,SEPTRA DS) 800-160 MG per tablet Take 1 tablet by mouth 2 (two) times daily. 08/20/14   Betancourt, Jarold Song, NP    Family History No family history on file.  Social History Social History   Tobacco Use  . Smoking status: Never  Smoker  . Smokeless tobacco: Never Used  Substance Use Topics  . Alcohol use: Yes    Comment: rare  . Drug use: No     Allergies   Bee venom; Pollen extract; and Wasp venom   Review of Systems Review of Systems  Constitutional: Negative for fever.  Musculoskeletal: Negative for arthralgias, back pain, joint swelling and myalgias.  Skin: Positive for wound.  Neurological: Negative for numbness.     Physical Exam Triage Vital Signs ED Triage Vitals [11/24/17 1017]  Enc Vitals Group     BP (!) 140/94     Pulse Rate 86     Resp 17     Temp 98.4 F (36.9 C)     Temp Source Oral     SpO2 100 %     Weight 215 lb (97.5 kg)     Height 5\' 11"  (1.803 m)     Head Circumference      Peak Flow      Pain Score 1     Pain Loc      Pain Edu?      Excl. in GC?    No data found.  Updated Vital Signs BP (!) 140/94 (BP Location: Left Arm)   Pulse 86   Temp 98.4 F (36.9 C) (Oral)   Resp 17   Ht 5'  11" (1.803 m)   Wt 215 lb (97.5 kg)   SpO2 100%   BMI 29.99 kg/m   Visual Acuity Right Eye Distance:   Left Eye Distance:   Bilateral Distance:    Right Eye Near:   Left Eye Near:    Bilateral Near:     Physical Exam  Constitutional: He is oriented to person, place, and time. He appears well-developed and well-nourished.  HENT:  Head: Normocephalic and atraumatic.  Eyes: Conjunctivae are normal.  Neck: Normal range of motion.  Cardiovascular: Normal rate.  Pulmonary/Chest: Effort normal. No respiratory distress.  Musculoskeletal: Normal range of motion.  Examination left thumb shows no swelling warmth erythema.  Ulnar aspect of the distal phalanx on the volar aspect of the thumb is a 0.5 x 0.5 cm abrasion with small puncture wound.  There is no visible or palpable foreign body.  Puncture wound appears to be healed over with no signs of any infection.  No tendon deficits noted.  Neurological: He is alert and oriented to person, place, and time.  Skin: Skin is warm. No  rash noted.  Psychiatric: He has a normal mood and affect. His behavior is normal. Thought content normal.     UC Treatments / Results  Labs (all labs ordered are listed, but only abnormal results are displayed) Labs Reviewed - No data to display  EKG None  Radiology No results found.  Procedures Procedures (including critical care time)  Medications Ordered in UC Medications  Tdap (BOOSTRIX) injection 0.5 mL (has no administration in time range)    Initial Impression / Assessment and Plan / UC Course  I have reviewed the triage vital signs and the nursing notes.  Pertinent labs & imaging results that were available during my care of the patient were reviewed by me and considered in my medical decision making (see chart for details).     37 year old male with minor puncture wound to the left thumb that occurred 2 days ago.  Wound appears to be healing well with no signs of pain on palpation with no warmth or redness.  He will continue to keep clean and apply Neosporin daily.  Patient is given tetanus vaccine today.  He understands signs symptoms return to the clinic for. Final Clinical Impressions(s) / UC Diagnoses   Final diagnoses:  Puncture wound of left thumb, initial encounter     Discharge Instructions     Please continue to keep left thumb clean, apply Neosporin daily.  If any swelling, increasing pain, redness return to the clinic.   ED Prescriptions    None       Evon Slack, [place] 11/24/17 1039

## 2017-11-24 NOTE — ED Triage Notes (Signed)
Pt states he accidentally stabbed his left thumb with a center punch trying to remove a stripped bolt, wound is healing well,. Pt states he has not had a tetanus vaccine in over 10 years.

## 2019-05-23 ENCOUNTER — Ambulatory Visit: Payer: Self-pay

## 2020-01-16 ENCOUNTER — Ambulatory Visit: Payer: Self-pay | Admitting: Physician Assistant

## 2020-01-16 ENCOUNTER — Other Ambulatory Visit: Payer: Self-pay

## 2020-01-16 DIAGNOSIS — Z113 Encounter for screening for infections with a predominantly sexual mode of transmission: Secondary | ICD-10-CM

## 2020-01-16 LAB — GRAM STAIN

## 2020-01-16 NOTE — Progress Notes (Signed)
Post:  RN reviewed gram stain with patient. No tx per S.O. Provder orders complete.   Harvie Heck, RN

## 2020-01-17 ENCOUNTER — Encounter: Payer: Self-pay | Admitting: Physician Assistant

## 2020-01-17 NOTE — Progress Notes (Signed)
   [facility] Department STI clinic/screening visit  Subjective:  Philip Bright is a 39 y.o. male being seen today for an STI screening visit. The patient reports they do not have symptoms.    Patient has the following medical conditions:  There are no problems to display for this patient.    Chief Complaint  Patient presents with  . SEXUALLY TRANSMITTED DISEASE    screening    HPI  Patient reports that he would like a screening today.  States that he is not having any symptoms but he and a new partner are getting screened before they start having sex.  Denies chronic conditions, surgeries, and regular medicines.  States last HIV test was in 2020 and last void prior to sample collection for Gram stain was over 2 hr ago.   See flowsheet for further details and programmatic requirements.    The following portions of the patient's history were reviewed and updated as appropriate: allergies, current medications, past medical history, past social history, past surgical history and problem list.  Objective:  There were no vitals filed for this visit.  Physical Exam Constitutional:      General: He is not in acute distress.    Appearance: Normal appearance.  HENT:     Head: Normocephalic and atraumatic.     Comments: No nits,lice, or hair loss. No cervical, supraclavicular or axillary adenopathy.    Mouth/Throat:     Mouth: Mucous membranes are moist.     Pharynx: Oropharynx is clear. No oropharyngeal exudate or posterior oropharyngeal erythema.  Eyes:     Conjunctiva/sclera: Conjunctivae normal.  Pulmonary:     Effort: Pulmonary effort is normal.  Abdominal:     Palpations: Abdomen is soft. There is no mass.     Tenderness: There is no abdominal tenderness. There is no guarding or rebound.  Genitourinary:    Penis: Normal.      Testes: Normal.     Comments: Pubic area without nits, lice, hair loss, edema, erythema, lesions and inguinal  adenopathy. Penis circumcised without rash, lesions and discharge at meatus. Musculoskeletal:     Cervical back: Neck supple. No tenderness.  Skin:    General: Skin is warm and dry.     Findings: No bruising, erythema, lesion or rash.  Neurological:     Mental Status: He is alert and oriented to person, place, and time.  Psychiatric:        Mood and Affect: Mood normal.        Behavior: Behavior normal.        Thought Content: Thought content normal.        Judgment: Judgment normal.       Assessment and Plan:  Philip Bright is a 39 y.o. male presenting to the [facility] Department for STI screening  1. Screening for STD (sexually transmitted disease) Patient into clinic without symptoms. Rec condoms with all sex. Await test results.  Counseled that RN will call if needs to RTC for treatment once results are back. - Gram stain - Gonococcus culture - HBV Antigen/Antibody [facility] - HIV/HCV [facility] - Syphilis Serology, [facility]     No follow-ups on file.  No future appointments.  Matt Holmes, PA

## 2020-01-21 LAB — GONOCOCCUS CULTURE

## 2020-01-28 LAB — HEPATITIS B SURFACE ANTIGEN: Hepatitis B Surface Ag: NEGATIVE
